# Patient Record
Sex: Male | Born: 1970 | Race: Black or African American | Hispanic: No | Marital: Married | Smoking: Never smoker
Health system: Southern US, Community
[De-identification: ages and names within clinical notes are randomized; demographics above are authoritative.]

---

## 2009-02-13 ENCOUNTER — Emergency Department (HOSPITAL_BASED_OUTPATIENT_CLINIC_OR_DEPARTMENT_OTHER): Admission: EM | Admit: 2009-02-13 | Discharge: 2009-02-13 | Payer: Self-pay | Admitting: Emergency Medicine

## 2013-01-16 ENCOUNTER — Encounter (HOSPITAL_COMMUNITY): Payer: Self-pay | Admitting: Emergency Medicine

## 2013-01-16 ENCOUNTER — Emergency Department (HOSPITAL_COMMUNITY)
Admission: EM | Admit: 2013-01-16 | Discharge: 2013-01-16 | Disposition: A | Discharge status: Home / Self Care | Payer: BC Managed Care – PPO | Attending: Emergency Medicine | Admitting: Emergency Medicine

## 2013-01-16 DIAGNOSIS — M79609 Pain in unspecified limb: Secondary | ICD-10-CM | POA: Insufficient documentation

## 2013-01-16 DIAGNOSIS — M79601 Pain in right arm: Secondary | ICD-10-CM

## 2013-01-16 DIAGNOSIS — Z79899 Other long term (current) drug therapy: Secondary | ICD-10-CM | POA: Insufficient documentation

## 2013-01-16 MED ORDER — HYDROCODONE-ACETAMINOPHEN 5-325 MG PO TABS: ORAL_TABLET | ORAL | Status: AC

## 2013-01-16 MED ORDER — METHOCARBAMOL 500 MG PO TABS: 1000.0000 mg | ORAL_TABLET | Freq: Four times a day (QID) | ORAL | Status: AC

## 2013-01-16 MED ORDER — PREDNISONE 20 MG PO TABS: ORAL_TABLET | ORAL | Status: AC

## 2013-01-16 MED ORDER — HYDROCODONE-ACETAMINOPHEN 5-325 MG PO TABS
1.0000 | ORAL_TABLET | Freq: Once | ORAL | Status: AC
  Administered 2013-01-16: 1 via ORAL
  Filled 2013-01-16: qty 1

## 2013-01-16 NOTE — ED Notes (Signed)
Pt c/o numbness and discomfort in right arm that started around 11:30 this morning when he was at work. Pt states it eased off and came back now its a throbbing consistent feeling. Pt able to move arm and fingers. Pt holding rt shoulder.
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
## 2013-01-16 NOTE — ED Notes (Signed)
Patient presents stating that about 1130 this morning he noticed some numbness to his right arm  Earlier this week he thought he slept wrong and noticed some soreness in his neck

## 2013-01-17 NOTE — ED Provider Notes (Signed)
Medical screening examination/treatment/procedure(s) were performed by non-physician practitioner and as supervising physician I was immediately available for consultation/collaboration.  EKG Interpretation   None        Flint Melter, MD 01/17/13 1152

## 2013-04-19 ENCOUNTER — Other Ambulatory Visit (HOSPITAL_COMMUNITY): Payer: Self-pay | Admitting: Endocrinology

## 2013-04-19 DIAGNOSIS — F909 Attention-deficit hyperactivity disorder, unspecified type: Secondary | ICD-10-CM

## 2013-04-19 DIAGNOSIS — O99280 Endocrine, nutritional and metabolic diseases complicating pregnancy, unspecified trimester: Secondary | ICD-10-CM

## 2013-04-19 DIAGNOSIS — E0590 Thyrotoxicosis, unspecified without thyrotoxic crisis or storm: Secondary | ICD-10-CM

## 2013-04-27 ENCOUNTER — Ambulatory Visit (HOSPITAL_COMMUNITY)
Admission: RE | Admit: 2013-04-27 | Discharge: 2013-04-27 | Disposition: A | Discharge status: Home / Self Care | Payer: BC Managed Care – PPO | Source: Ambulatory Visit | Attending: Endocrinology | Admitting: Endocrinology

## 2013-04-27 DIAGNOSIS — O99280 Endocrine, nutritional and metabolic diseases complicating pregnancy, unspecified trimester: Principal | ICD-10-CM

## 2013-04-27 DIAGNOSIS — E0590 Thyrotoxicosis, unspecified without thyrotoxic crisis or storm: Secondary | ICD-10-CM

## 2013-04-28 ENCOUNTER — Encounter (HOSPITAL_COMMUNITY)
Admission: RE | Admit: 2013-04-28 | Discharge: 2013-04-28 | Disposition: A | Discharge status: Home / Self Care | Payer: BC Managed Care – PPO | Source: Ambulatory Visit | Attending: Endocrinology | Admitting: Endocrinology

## 2013-04-28 ENCOUNTER — Other Ambulatory Visit (HOSPITAL_COMMUNITY): Payer: Self-pay | Admitting: Endocrinology

## 2013-04-28 DIAGNOSIS — E0590 Thyrotoxicosis, unspecified without thyrotoxic crisis or storm: Secondary | ICD-10-CM

## 2013-04-28 DIAGNOSIS — E0510 Thyrotoxicosis with toxic single thyroid nodule without thyrotoxic crisis or storm: Secondary | ICD-10-CM | POA: Insufficient documentation

## 2013-04-28 MED ORDER — SODIUM PERTECHNETATE TC 99M INJECTION
10.5000 | Freq: Once | INTRAVENOUS | Status: AC | PRN
  Administered 2013-04-28: 10.5 via INTRAVENOUS

## 2013-04-28 MED ORDER — SODIUM IODIDE I 131 CAPSULE
8.4000 | Freq: Once | INTRAVENOUS | Status: AC | PRN
  Administered 2013-04-28: 8.4 via ORAL

## 2013-05-06 ENCOUNTER — Other Ambulatory Visit: Payer: Self-pay | Admitting: Endocrinology

## 2013-05-06 DIAGNOSIS — E041 Nontoxic single thyroid nodule: Secondary | ICD-10-CM

## 2013-05-20 ENCOUNTER — Inpatient Hospital Stay: Admission: RE | Admit: 2013-05-20 | Payer: BC Managed Care – PPO | Source: Ambulatory Visit

## 2013-05-20 ENCOUNTER — Inpatient Hospital Stay
Admission: RE | Admit: 2013-05-20 | Discharge: 2013-05-20 | Disposition: A | Discharge status: Home / Self Care | Payer: BC Managed Care – PPO | Source: Ambulatory Visit | Attending: Endocrinology | Admitting: Endocrinology

## 2013-06-03 ENCOUNTER — Ambulatory Visit
Admission: RE | Admit: 2013-06-03 | Discharge: 2013-06-03 | Disposition: A | Discharge status: Home / Self Care | Payer: BC Managed Care – PPO | Source: Ambulatory Visit | Attending: Endocrinology | Admitting: Endocrinology

## 2013-06-03 ENCOUNTER — Other Ambulatory Visit (HOSPITAL_COMMUNITY)
Admission: RE | Admit: 2013-06-03 | Discharge: 2013-06-03 | Disposition: A | Discharge status: Home / Self Care | Payer: BC Managed Care – PPO | Source: Ambulatory Visit | Attending: Interventional Radiology | Admitting: Interventional Radiology

## 2013-06-03 DIAGNOSIS — E041 Nontoxic single thyroid nodule: Secondary | ICD-10-CM

## 2013-06-03 DIAGNOSIS — E049 Nontoxic goiter, unspecified: Secondary | ICD-10-CM | POA: Insufficient documentation

## 2015-04-25 ENCOUNTER — Other Ambulatory Visit: Payer: Self-pay | Admitting: Endocrinology

## 2015-04-25 DIAGNOSIS — E041 Nontoxic single thyroid nodule: Secondary | ICD-10-CM

## 2015-04-28 ENCOUNTER — Ambulatory Visit
Admission: RE | Admit: 2015-04-28 | Discharge: 2015-04-28 | Disposition: A | Discharge status: Home / Self Care | Payer: BLUE CROSS/BLUE SHIELD | Source: Ambulatory Visit | Attending: Endocrinology | Admitting: Endocrinology

## 2015-04-28 DIAGNOSIS — E041 Nontoxic single thyroid nodule: Secondary | ICD-10-CM

## 2015-06-16 DIAGNOSIS — S30861A Insect bite (nonvenomous) of abdominal wall, initial encounter: Secondary | ICD-10-CM | POA: Diagnosis not present

## 2015-06-16 DIAGNOSIS — W57XXXA Bitten or stung by nonvenomous insect and other nonvenomous arthropods, initial encounter: Secondary | ICD-10-CM | POA: Diagnosis not present

## 2015-08-07 DIAGNOSIS — S0096XA Insect bite (nonvenomous) of unspecified part of head, initial encounter: Secondary | ICD-10-CM | POA: Diagnosis not present

## 2015-08-07 DIAGNOSIS — R42 Dizziness and giddiness: Secondary | ICD-10-CM | POA: Diagnosis not present

## 2015-08-07 DIAGNOSIS — I319 Disease of pericardium, unspecified: Secondary | ICD-10-CM | POA: Diagnosis not present

## 2016-04-30 DIAGNOSIS — E041 Nontoxic single thyroid nodule: Secondary | ICD-10-CM | POA: Diagnosis not present

## 2016-05-07 ENCOUNTER — Other Ambulatory Visit: Payer: Self-pay | Admitting: Endocrinology

## 2016-05-07 DIAGNOSIS — E041 Nontoxic single thyroid nodule: Secondary | ICD-10-CM

## 2016-05-07 DIAGNOSIS — E0590 Thyrotoxicosis, unspecified without thyrotoxic crisis or storm: Secondary | ICD-10-CM | POA: Diagnosis not present

## 2016-05-14 ENCOUNTER — Other Ambulatory Visit: Payer: BLUE CROSS/BLUE SHIELD

## 2016-05-17 ENCOUNTER — Ambulatory Visit
Admission: RE | Admit: 2016-05-17 | Discharge: 2016-05-17 | Disposition: A | Discharge status: Home / Self Care | Payer: BLUE CROSS/BLUE SHIELD | Source: Ambulatory Visit | Attending: Endocrinology | Admitting: Endocrinology

## 2016-05-17 DIAGNOSIS — E041 Nontoxic single thyroid nodule: Secondary | ICD-10-CM

## 2016-05-17 DIAGNOSIS — E042 Nontoxic multinodular goiter: Secondary | ICD-10-CM | POA: Diagnosis not present

## 2016-05-31 ENCOUNTER — Other Ambulatory Visit: Payer: Self-pay | Admitting: Endocrinology

## 2016-05-31 DIAGNOSIS — E041 Nontoxic single thyroid nodule: Secondary | ICD-10-CM

## 2016-06-04 ENCOUNTER — Other Ambulatory Visit (HOSPITAL_COMMUNITY)
Admission: RE | Admit: 2016-06-04 | Discharge: 2016-06-04 | Disposition: A | Discharge status: Home / Self Care | Payer: BLUE CROSS/BLUE SHIELD | Source: Ambulatory Visit | Attending: Radiology | Admitting: Radiology

## 2016-06-04 ENCOUNTER — Ambulatory Visit
Admission: RE | Admit: 2016-06-04 | Discharge: 2016-06-04 | Disposition: A | Discharge status: Home / Self Care | Payer: BLUE CROSS/BLUE SHIELD | Source: Ambulatory Visit | Attending: Endocrinology | Admitting: Endocrinology

## 2016-06-04 DIAGNOSIS — E041 Nontoxic single thyroid nodule: Secondary | ICD-10-CM | POA: Diagnosis not present

## 2017-05-13 DIAGNOSIS — E041 Nontoxic single thyroid nodule: Secondary | ICD-10-CM | POA: Diagnosis not present

## 2018-04-02 DIAGNOSIS — M5386 Other specified dorsopathies, lumbar region: Secondary | ICD-10-CM | POA: Diagnosis not present

## 2018-04-02 DIAGNOSIS — M5137 Other intervertebral disc degeneration, lumbosacral region: Secondary | ICD-10-CM | POA: Diagnosis not present

## 2018-04-02 DIAGNOSIS — M9903 Segmental and somatic dysfunction of lumbar region: Secondary | ICD-10-CM | POA: Diagnosis not present

## 2018-04-02 DIAGNOSIS — M9904 Segmental and somatic dysfunction of sacral region: Secondary | ICD-10-CM | POA: Diagnosis not present

## 2018-04-03 DIAGNOSIS — M545 Low back pain: Secondary | ICD-10-CM | POA: Diagnosis not present

## 2018-04-03 DIAGNOSIS — M5137 Other intervertebral disc degeneration, lumbosacral region: Secondary | ICD-10-CM | POA: Diagnosis not present

## 2018-04-03 DIAGNOSIS — M9904 Segmental and somatic dysfunction of sacral region: Secondary | ICD-10-CM | POA: Diagnosis not present

## 2018-04-03 DIAGNOSIS — M9903 Segmental and somatic dysfunction of lumbar region: Secondary | ICD-10-CM | POA: Diagnosis not present

## 2018-04-03 DIAGNOSIS — M5386 Other specified dorsopathies, lumbar region: Secondary | ICD-10-CM | POA: Diagnosis not present

## 2018-04-09 DIAGNOSIS — M9904 Segmental and somatic dysfunction of sacral region: Secondary | ICD-10-CM | POA: Diagnosis not present

## 2018-04-09 DIAGNOSIS — M5137 Other intervertebral disc degeneration, lumbosacral region: Secondary | ICD-10-CM | POA: Diagnosis not present

## 2018-04-09 DIAGNOSIS — M9903 Segmental and somatic dysfunction of lumbar region: Secondary | ICD-10-CM | POA: Diagnosis not present

## 2018-04-09 DIAGNOSIS — M5386 Other specified dorsopathies, lumbar region: Secondary | ICD-10-CM | POA: Diagnosis not present

## 2018-04-16 DIAGNOSIS — M5137 Other intervertebral disc degeneration, lumbosacral region: Secondary | ICD-10-CM | POA: Diagnosis not present

## 2018-04-16 DIAGNOSIS — M9903 Segmental and somatic dysfunction of lumbar region: Secondary | ICD-10-CM | POA: Diagnosis not present

## 2018-04-16 DIAGNOSIS — M5386 Other specified dorsopathies, lumbar region: Secondary | ICD-10-CM | POA: Diagnosis not present

## 2018-04-16 DIAGNOSIS — M9904 Segmental and somatic dysfunction of sacral region: Secondary | ICD-10-CM | POA: Diagnosis not present

## 2018-04-23 DIAGNOSIS — M9903 Segmental and somatic dysfunction of lumbar region: Secondary | ICD-10-CM | POA: Diagnosis not present

## 2018-04-23 DIAGNOSIS — M545 Low back pain: Secondary | ICD-10-CM | POA: Diagnosis not present

## 2018-04-23 DIAGNOSIS — M9904 Segmental and somatic dysfunction of sacral region: Secondary | ICD-10-CM | POA: Diagnosis not present

## 2018-04-23 DIAGNOSIS — M5137 Other intervertebral disc degeneration, lumbosacral region: Secondary | ICD-10-CM | POA: Diagnosis not present

## 2018-04-23 DIAGNOSIS — M5386 Other specified dorsopathies, lumbar region: Secondary | ICD-10-CM | POA: Diagnosis not present

## 2018-04-29 ENCOUNTER — Other Ambulatory Visit: Payer: Self-pay | Admitting: Endocrinology

## 2018-04-29 DIAGNOSIS — E041 Nontoxic single thyroid nodule: Secondary | ICD-10-CM

## 2018-05-07 DIAGNOSIS — M5137 Other intervertebral disc degeneration, lumbosacral region: Secondary | ICD-10-CM | POA: Diagnosis not present

## 2018-05-07 DIAGNOSIS — M9903 Segmental and somatic dysfunction of lumbar region: Secondary | ICD-10-CM | POA: Diagnosis not present

## 2018-05-07 DIAGNOSIS — E041 Nontoxic single thyroid nodule: Secondary | ICD-10-CM | POA: Diagnosis not present

## 2018-05-07 DIAGNOSIS — M9904 Segmental and somatic dysfunction of sacral region: Secondary | ICD-10-CM | POA: Diagnosis not present

## 2018-05-07 DIAGNOSIS — M5386 Other specified dorsopathies, lumbar region: Secondary | ICD-10-CM | POA: Diagnosis not present

## 2018-05-12 DIAGNOSIS — M545 Low back pain: Secondary | ICD-10-CM | POA: Diagnosis not present

## 2018-05-13 DIAGNOSIS — M545 Low back pain: Secondary | ICD-10-CM | POA: Diagnosis not present

## 2018-05-14 DIAGNOSIS — E041 Nontoxic single thyroid nodule: Secondary | ICD-10-CM | POA: Diagnosis not present

## 2018-05-14 DIAGNOSIS — E0590 Thyrotoxicosis, unspecified without thyrotoxic crisis or storm: Secondary | ICD-10-CM | POA: Diagnosis not present

## 2018-05-21 DIAGNOSIS — M48061 Spinal stenosis, lumbar region without neurogenic claudication: Secondary | ICD-10-CM | POA: Diagnosis not present

## 2018-05-28 DIAGNOSIS — M545 Low back pain: Secondary | ICD-10-CM | POA: Diagnosis not present

## 2018-05-28 DIAGNOSIS — M5416 Radiculopathy, lumbar region: Secondary | ICD-10-CM | POA: Diagnosis not present

## 2018-05-28 DIAGNOSIS — M48061 Spinal stenosis, lumbar region without neurogenic claudication: Secondary | ICD-10-CM | POA: Diagnosis not present

## 2018-06-25 ENCOUNTER — Other Ambulatory Visit: Payer: BLUE CROSS/BLUE SHIELD

## 2018-08-13 DIAGNOSIS — E041 Nontoxic single thyroid nodule: Secondary | ICD-10-CM | POA: Diagnosis not present

## 2018-08-17 ENCOUNTER — Other Ambulatory Visit: Payer: BLUE CROSS/BLUE SHIELD

## 2018-08-27 ENCOUNTER — Ambulatory Visit
Admission: RE | Admit: 2018-08-27 | Discharge: 2018-08-27 | Disposition: A | Discharge status: Home / Self Care | Payer: BC Managed Care – PPO | Source: Ambulatory Visit | Attending: Endocrinology | Admitting: Endocrinology

## 2018-08-27 DIAGNOSIS — E041 Nontoxic single thyroid nodule: Secondary | ICD-10-CM | POA: Diagnosis not present

## 2019-01-07 ENCOUNTER — Other Ambulatory Visit: Payer: Self-pay

## 2019-01-07 DIAGNOSIS — Z20822 Contact with and (suspected) exposure to covid-19: Secondary | ICD-10-CM

## 2019-01-11 LAB — NOVEL CORONAVIRUS, NAA: SARS-CoV-2, NAA: DETECTED — AB

## 2019-02-17 IMAGING — US US THYROID
1 series · 16 of 25 positions shown · non-contrast
Comparison: 04/28/2015
COMPARISON: 04/28/2015

ADDENDUM:
The measurements of nodule to a been corrected. Actual measurements
are 2.6 x 1.8 x 2.0 cm. Celsius corrected dictation below:
CLINICAL DATA: Prior ultrasound follow-up.

EXAM:
THYROID ULTRASOUND
TECHNIQUE: Ultrasound examination of the thyroid gland and adjacent soft
tissues was performed.

[Series 1: us thyroid · 0.07mm/px · 65 acquisitions, 16 frames shown]
[im 1/65]
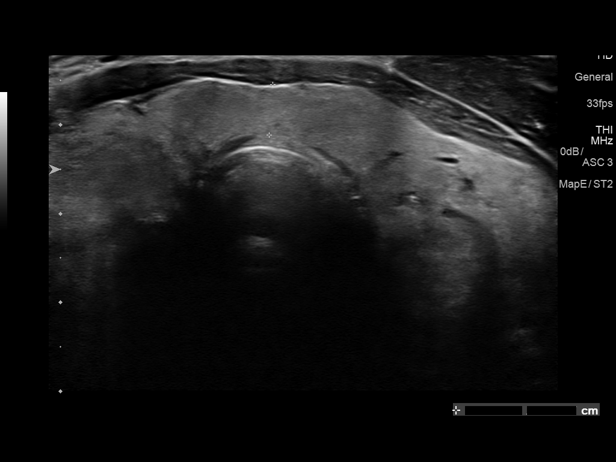
[im 6/65]
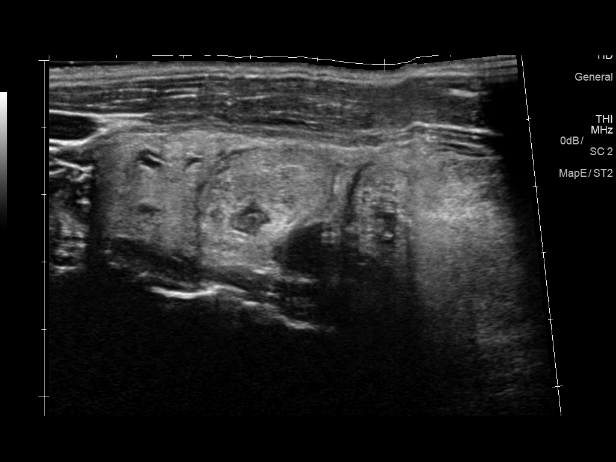
[im 9/65]
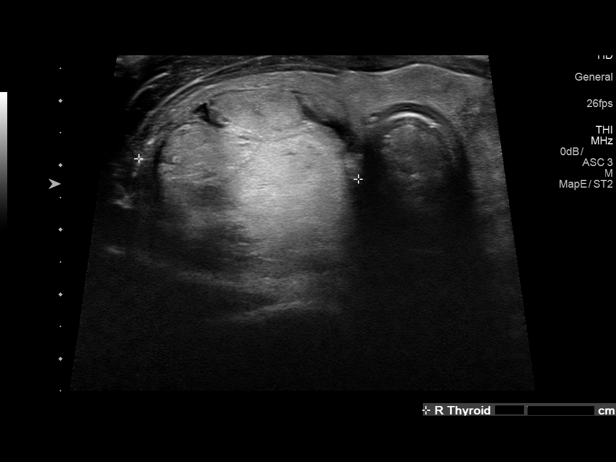
[im 14/65]
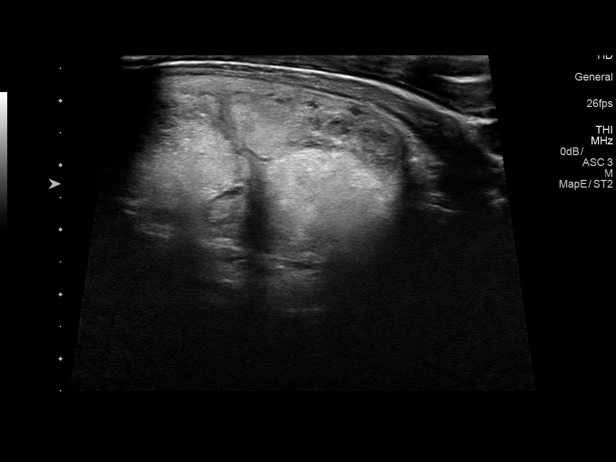
[im 19/65]
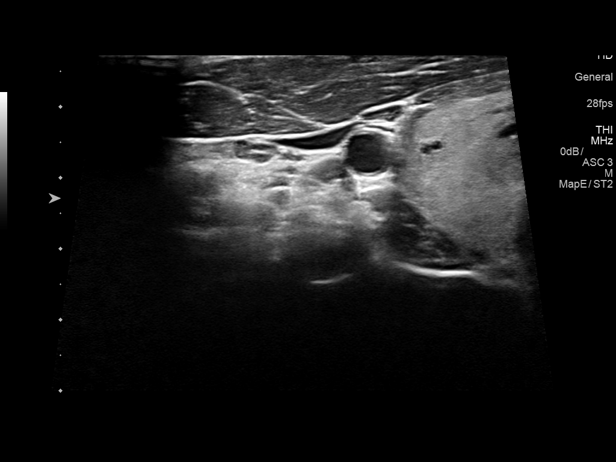
[im 22/65]
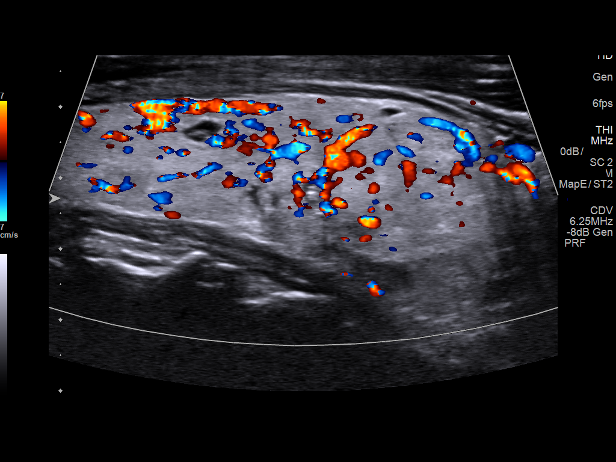
[im 27/65]
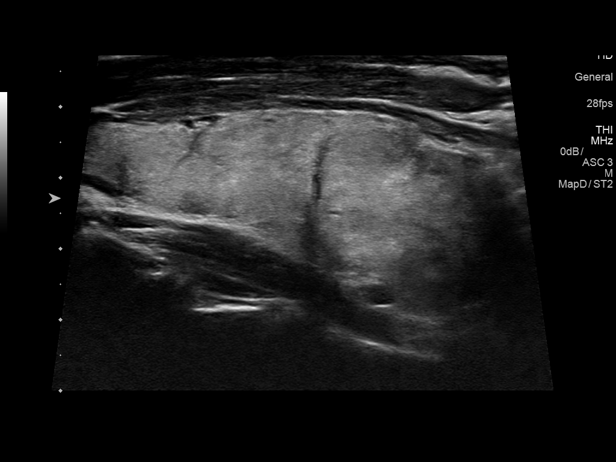
[im 30/65]
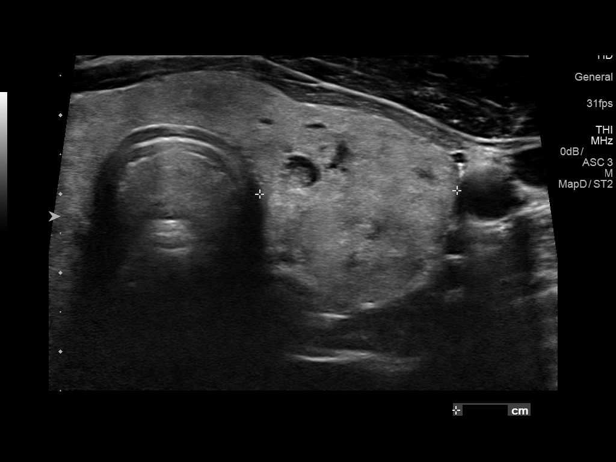
[im 35/65]
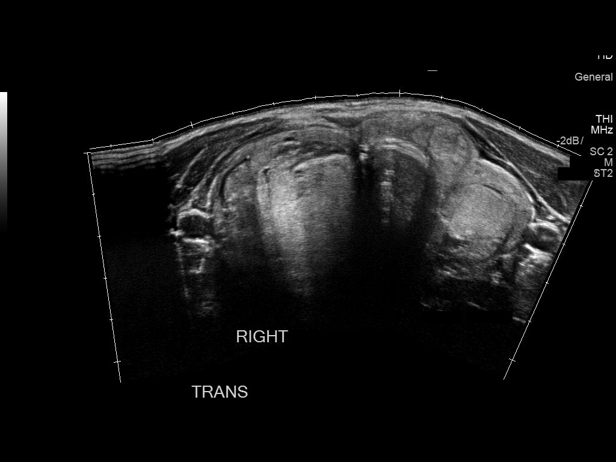
[im 38/65]
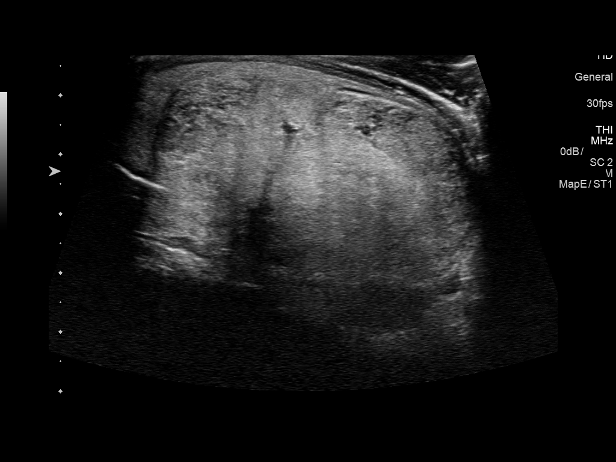
[im 43/65]
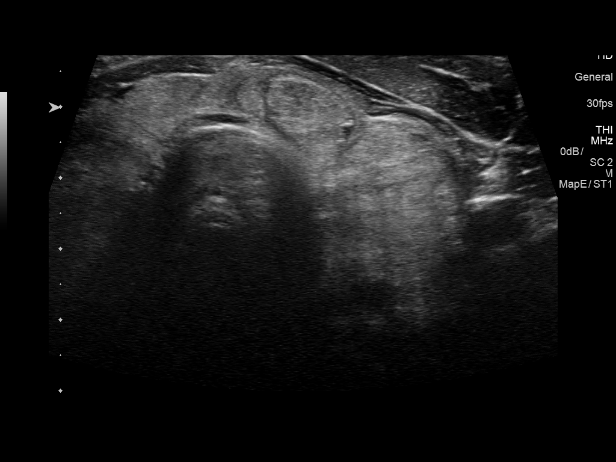
[im 46/65]
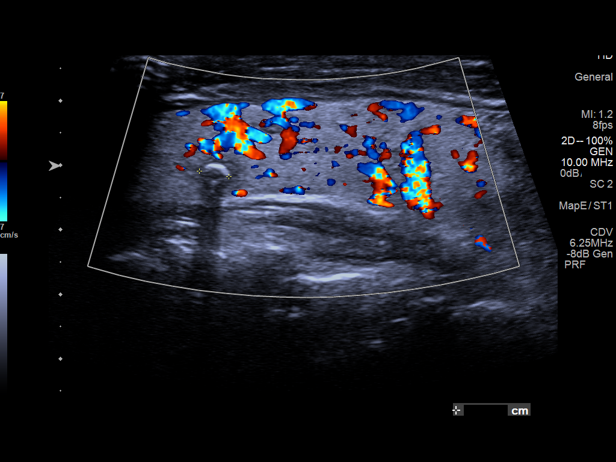
[im 51/65]
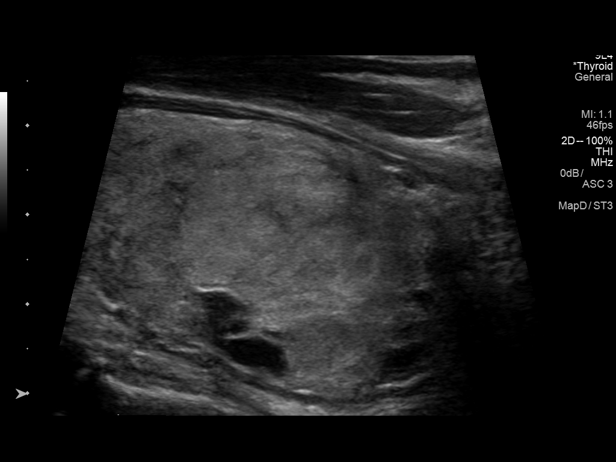
[im 57/65]
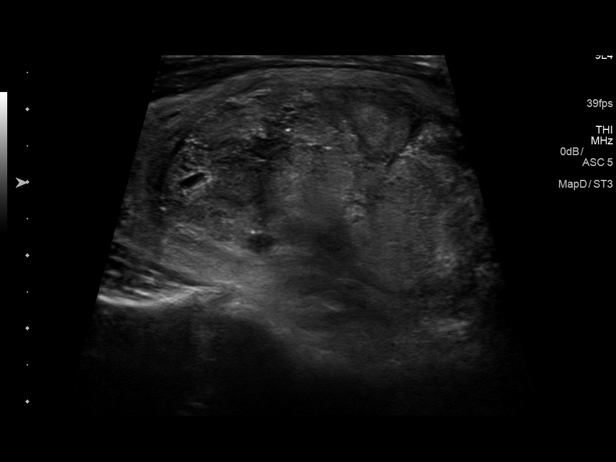
[im 59/65]
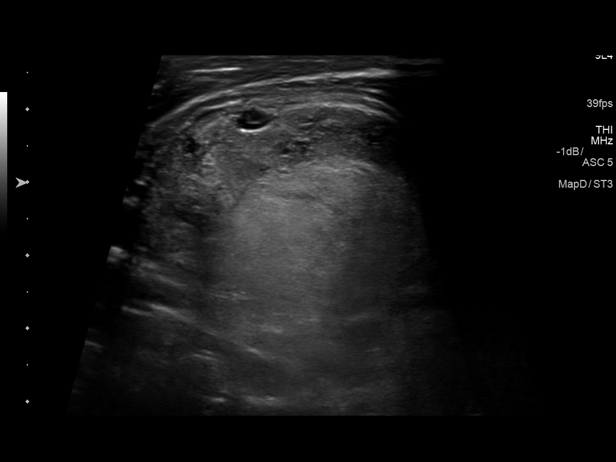
[im 65/65]
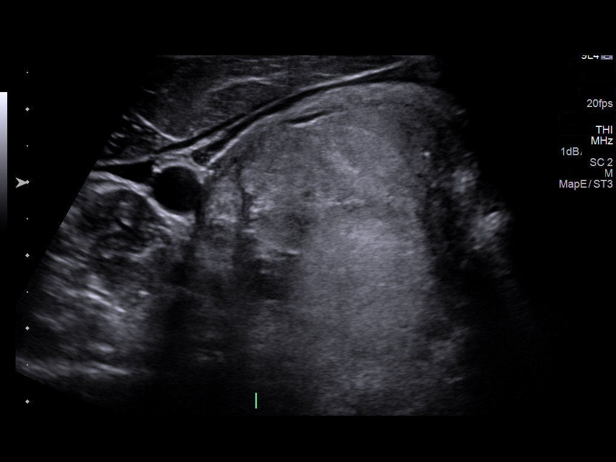

[16 of 25 positions shown; findings below may reference images not displayed]

FINDINGS: Parenchymal Echotexture: Markedly heterogenous

Isthmus: 0.6 cm, previously 0.5 cm

Right lobe: 7.1 x 2.2 x 3.4 cm, previously 7.5 x 3.5 x 3.4 cm

Left lobe: 6.6 x 2.4 x 2.5 cm, previously 7.1 x 2.5 x 2.5 cm

_________________________________________________________

Estimated total number of nodules >/= 1 cm: 3

Number of spongiform nodules >/= 2 cm not described below (TR1): 0

Number of mixed cystic and solid nodules >/= 1.5 cm not described
below (TR2): 0

_________________________________________________________

Nodule # 1:

Prior biopsy: No

Location: Isthmus; Mid

Maximum size: 1.7 cm; Other 2 dimensions: 0.9 x 1.3 cm, previously,
1.5 cm

Composition: solid/almost completely solid (2)

Echogenicity: isoechoic (1)

Shape: not taller-than-wide (0)

Margins: smooth (0)

Echogenic foci: none (0)

ACR TI-RADS total points: 3.

ACR TI-RADS risk category: TR3 (3 points).

Significant change in size (>/= 20% in two dimensions and minimal
increase of 2 mm): No

Change in features: No

Change in ACR TI-RADS risk category: No

ACR TI-RADS recommendations:

*Given size (>/= 1.5 - 2.4 cm) and appearance, a follow-up
ultrasound in 1 year should be considered based on TI-RADS criteria.
No significant change.

_________________________________________________________

Nodule # 2:

Prior biopsy: No

Location: Left; Inferior

Maximum size: 2.6 cm; Other 2 dimensions: 1.8 x 2.0 cm, previously,
0.3 cm

Composition: solid/almost completely solid (2)

Echogenicity: isoechoic (1)

Shape: not taller-than-wide (0)

Margins: smooth (0)

Echogenic foci: none (0)

ACR TI-RADS total points: 3.

ACR TI-RADS risk category: TR3 (3 points).

Significant change in size (>/= 20% in two dimensions and minimal
increase of 2 mm): No

Change in features: No

Change in ACR TI-RADS risk category: No

ACR TI-RADS recommendations:

**Given size (>/= 2.5 cm) and appearance, fine needle aspiration of
this mildly suspicious nodule should be considered based on TI-RADS
criteria. This nodule is not significantly changed.

_________________________________________________________

Right nodule measures 5.9 cm and previously measured 5.9 cm. It was
previously biopsied.
IMPRESSION: Multiple bilateral nodules are not significantly changed as
described above.

The above is in keeping with the ACR TI-RADS recommendations - [HOSPITAL] 3185;[DATE].
FINDINGS: Parenchymal Echotexture: Markedly heterogenous

Isthmus: 0.6 cm, previously 0.5 cm

Right lobe: 7.1 x 2.2 x 3.4 cm, previously 7.5 x 3.5 x 3.4 cm

Left lobe: 6.6 x 2.4 x 2.5 cm, previously 7.1 x 2.5 x 2.5 cm

_________________________________________________________

Estimated total number of nodules >/= 1 cm: 3

Number of spongiform nodules >/=  2 cm not described below (TR1): 0

Number of mixed cystic and solid nodules >/= 1.5 cm not described
below (TR2): 0

_________________________________________________________

Nodule # 1:

Prior biopsy: No

Location: Isthmus; Mid

Maximum size: 1.7 cm; Other 2 dimensions: 0.9 x 1.3 cm, previously,
1.5 cm

Composition: solid/almost completely solid (2)

Echogenicity: isoechoic (1)

Shape: not taller-than-wide (0)

Margins: smooth (0)

Echogenic foci: none (0)

ACR TI-RADS total points: 3.

ACR TI-RADS risk category:  TR3 (3 points).

Significant change in size (>/= 20% in two dimensions and minimal
increase of 2 mm): No

Change in features: No

Change in ACR TI-RADS risk category: No

ACR TI-RADS recommendations:

*Given size (>/= 1.5 - 2.4 cm) and appearance, a follow-up
ultrasound in 1 year should be considered based on TI-RADS criteria.
No significant change.

_________________________________________________________

Nodule # 2:

Prior biopsy: No

Location: Left; Inferior

Maximum size: 0.6 cm; Other 2 dimensions: 1.8 x 2.0 cm, previously,
0.3 cm

Composition: solid/almost completely solid (2)

Echogenicity: isoechoic (1)

Shape: not taller-than-wide (0)

Margins: smooth (0)

Echogenic foci: none (0)

ACR TI-RADS total points: 3.

ACR TI-RADS risk category:  TR3 (3 points).

Significant change in size (>/= 20% in two dimensions and minimal
increase of 2 mm): No

Change in features: No

Change in ACR TI-RADS risk category: No

ACR TI-RADS recommendations:

**Given size (>/= 2.5 cm) and appearance, fine needle aspiration of
this mildly suspicious nodule should be considered based on TI-RADS
criteria. This nodule is not significantly changed.

_________________________________________________________

Right nodule measures 5.9 cm and previously measured 5.9 cm. It was
previously biopsied.
IMPRESSION: Multiple bilateral nodules are not significantly changed as
described above.

The above is in keeping with the ACR TI-RADS recommendations - [HOSPITAL] 3185;[DATE].

## 2019-04-01 DIAGNOSIS — R42 Dizziness and giddiness: Secondary | ICD-10-CM | POA: Diagnosis not present

## 2019-04-01 DIAGNOSIS — E041 Nontoxic single thyroid nodule: Secondary | ICD-10-CM | POA: Diagnosis not present

## 2019-04-01 DIAGNOSIS — E0590 Thyrotoxicosis, unspecified without thyrotoxic crisis or storm: Secondary | ICD-10-CM | POA: Diagnosis not present

## 2019-05-20 DIAGNOSIS — E041 Nontoxic single thyroid nodule: Secondary | ICD-10-CM | POA: Diagnosis not present

## 2019-05-27 DIAGNOSIS — E0590 Thyrotoxicosis, unspecified without thyrotoxic crisis or storm: Secondary | ICD-10-CM | POA: Diagnosis not present

## 2019-05-27 DIAGNOSIS — E041 Nontoxic single thyroid nodule: Secondary | ICD-10-CM | POA: Diagnosis not present

## 2020-05-18 DIAGNOSIS — E041 Nontoxic single thyroid nodule: Secondary | ICD-10-CM | POA: Diagnosis not present

## 2021-05-17 IMAGING — US US THYROID
1 series · 13 of 25 positions shown · non-contrast
Comparison: May 17, 2016

CLINICAL DATA: 48-year-old male with a history of thyroid nodules

Biopsy of the right thyroid nodule has been performed June 03, 2013. Biopsy of the left thyroid nodule has performed June 04, 2016
EXAM:
THYROID ULTRASOUND
TECHNIQUE: Ultrasound examination of the thyroid gland and adjacent soft
tissues was performed.

[Series 1: us thyroid · 0.06mm/px · 13 of 77 slices shown]
[im 1/77]
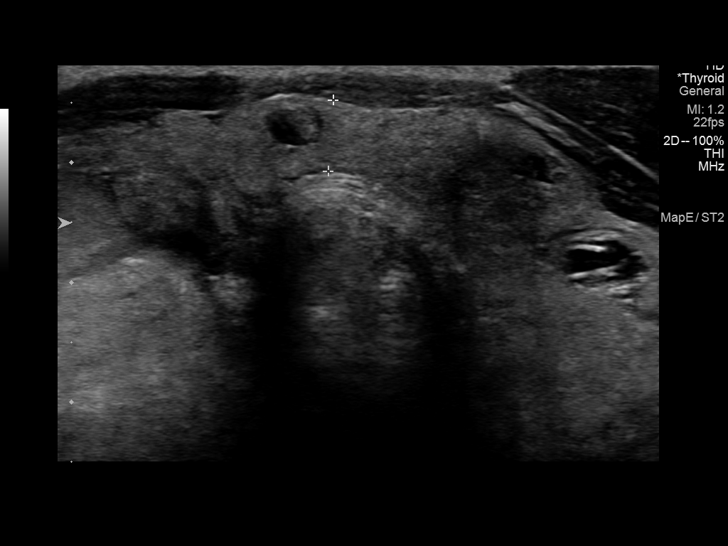
[im 7/77]
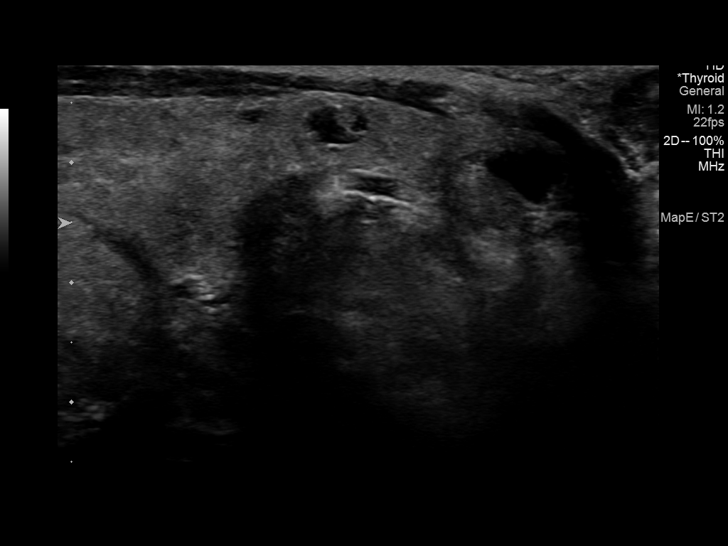
[im 13/77]
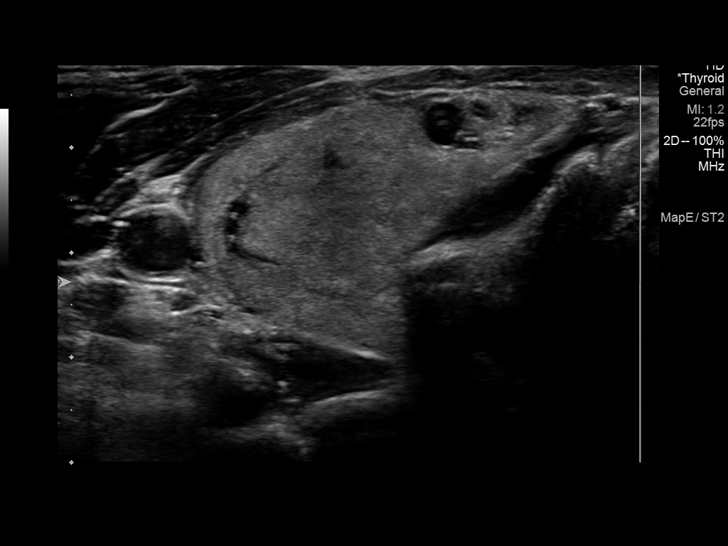
[im 20/77]
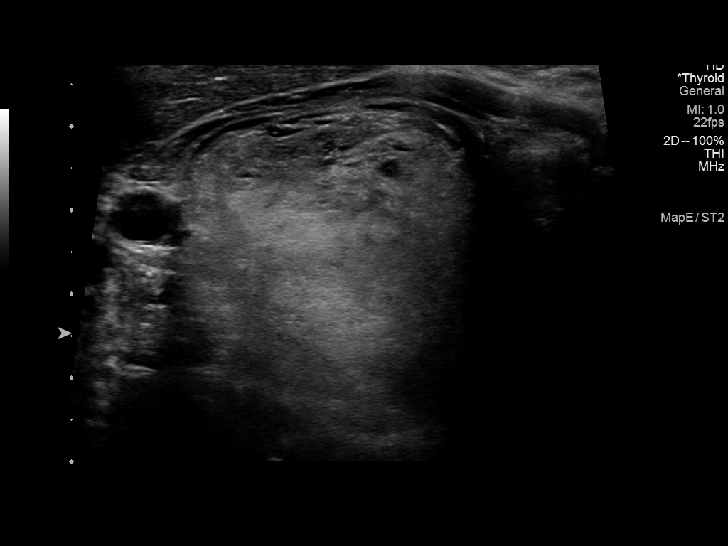
[im 26/77]
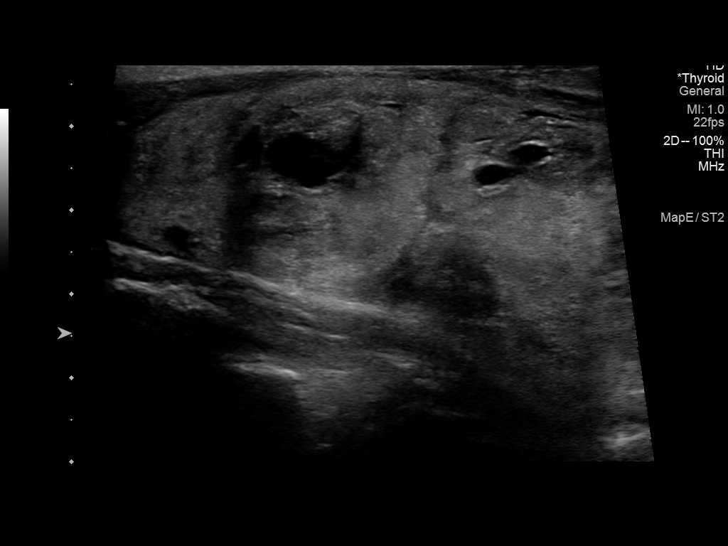
[im 32/77]
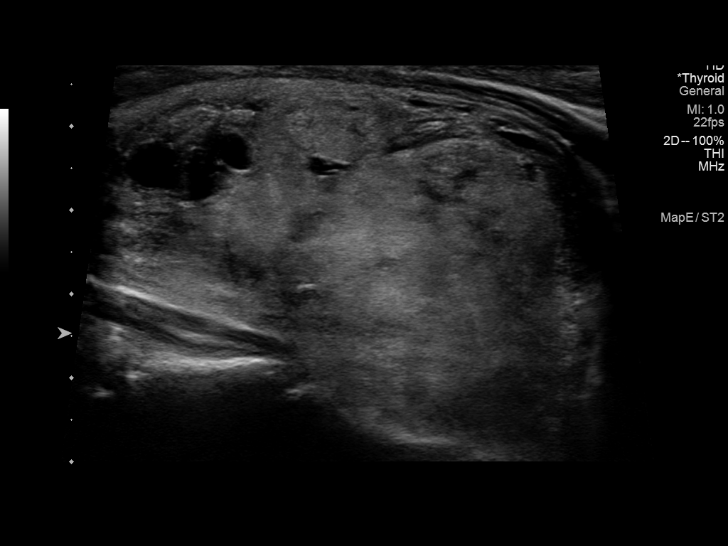
[im 39/77]
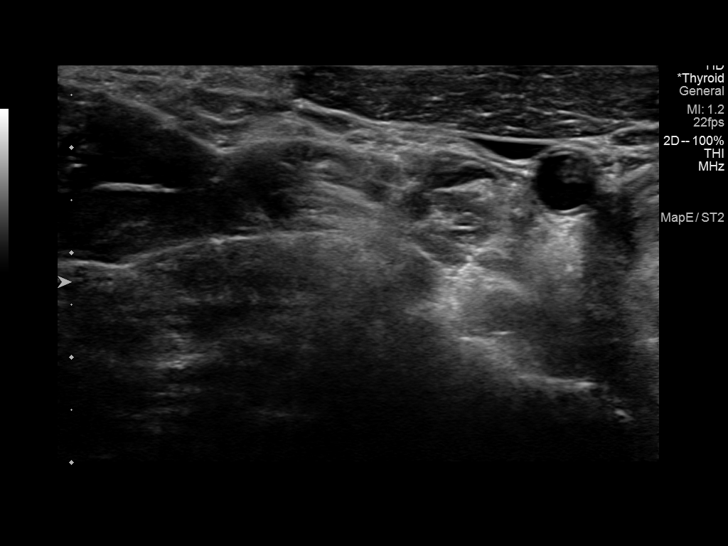
[im 45/77]
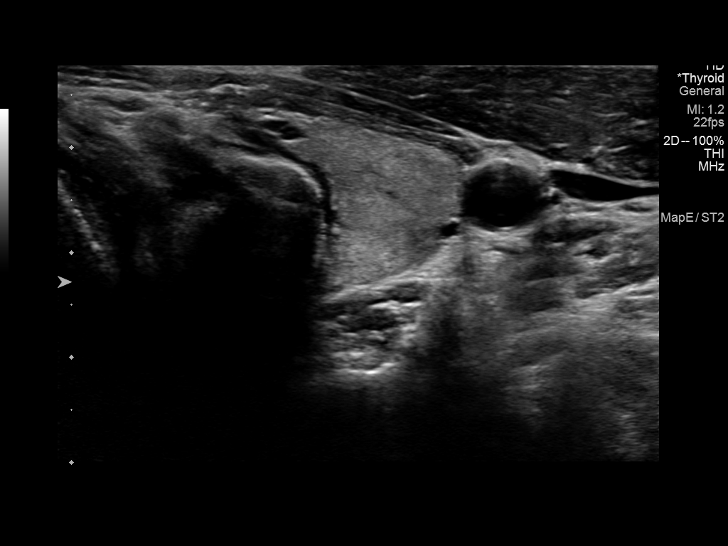
[im 51/77]
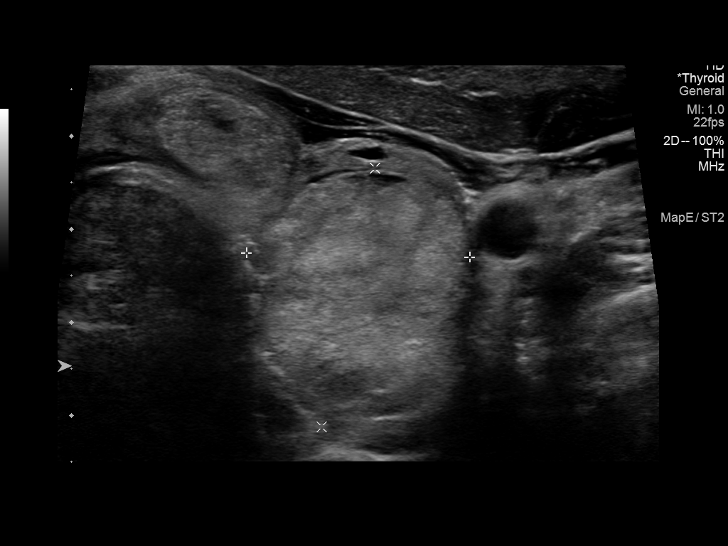
[im 58/77]
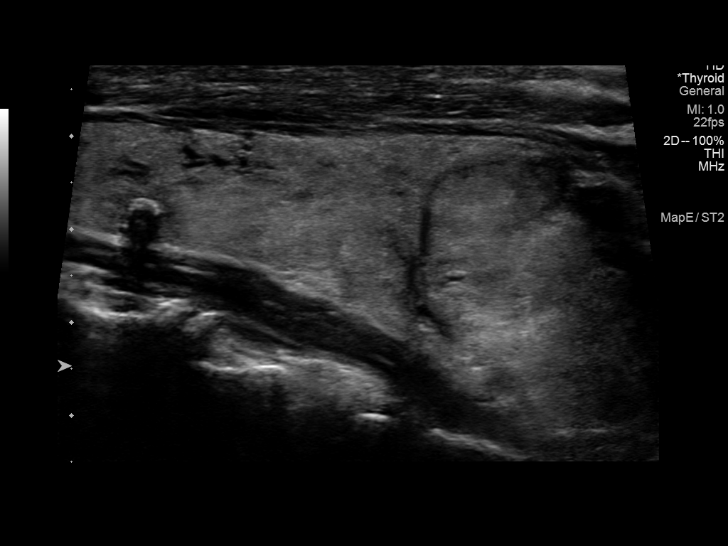
[im 64/77]
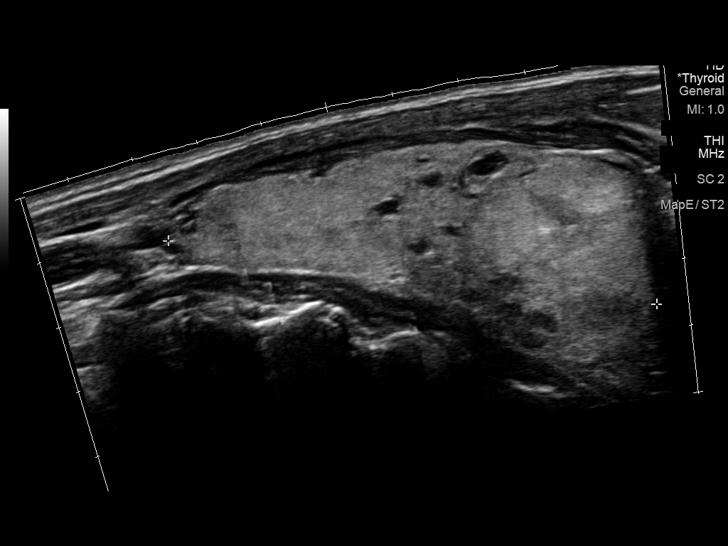
[im 70/77]
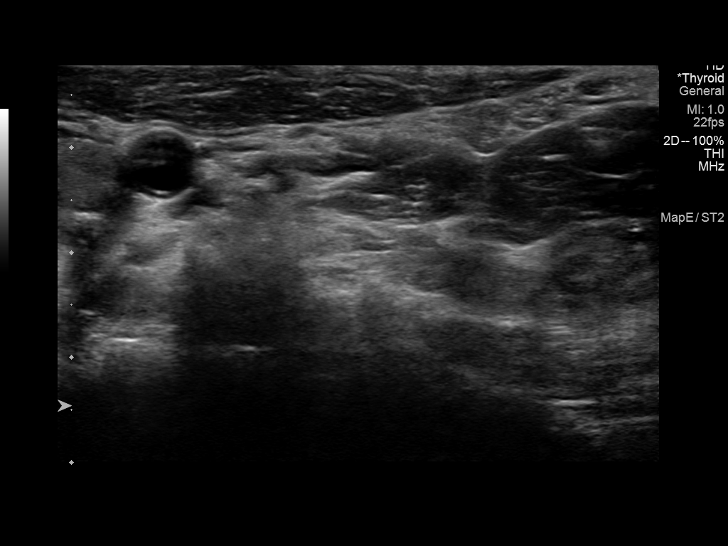
[im 77/77]
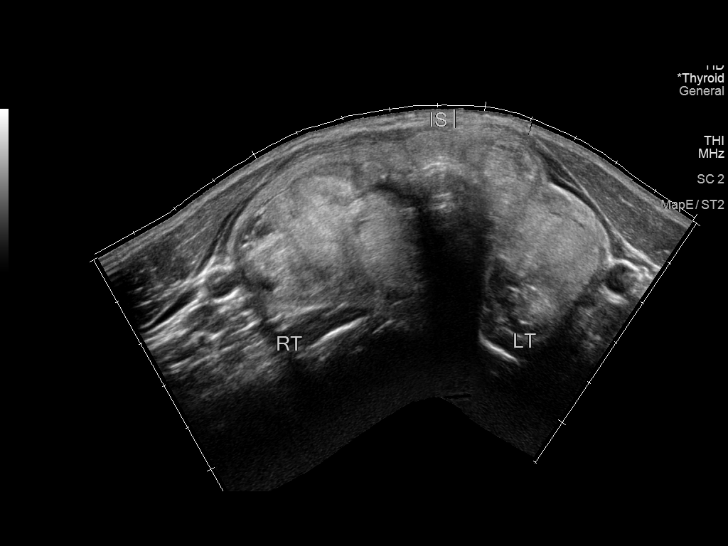

[13 of 25 positions shown; findings below may reference images not displayed]

FINDINGS: Parenchymal Echotexture: Moderately heterogenous

Isthmus: 0.6 cm

Right lobe: 7.5 cm x 3.8 cm x 4.1 cm

Left lobe: 7.3 cm x 3.1 cm x 2.6 cm

_________________________________________________________

Estimated total number of nodules >/= 1 cm: 3

Number of spongiform nodules >/=  2 cm not described below (TR1): 0

Number of mixed cystic and solid nodules >/= 1.5 cm not described
below (TR2): 0

_________________________________________________________

Nodule labeled 1 within the right thyroid measures slightly larger
than previous, currently 6.2 cm. This nodule was previously
biopsied.

Nodule at the inferior left thyroid labeled 2 has increased in size
from the prior, currently 4.7 cm

Nodule # 3:

Location: Isthmus; left

Maximum size: 2.0 cm; Other 2 dimensions: 1.4 cm x 1.9 cm

Composition: cannot determine (2)

Echogenicity: isoechoic (1)

Shape: not taller-than-wide (0)

Margins: smooth (0)

Echogenic foci: none (0)

ACR TI-RADS total points: 3.

ACR TI-RADS risk category: TR3 (3 points).

ACR TI-RADS recommendations:

Nodule meets criteria for surveillance

Nodule # 4:

Location: Isthmus; right

Maximum size: 0.6 cm cm; Other 2 dimensions: 0.3 cm x 0.6 cm cm

Composition: mixed cystic and solid (1)

Echogenicity: isoechoic (1)

Shape: not taller-than-wide (0)

Margins: smooth (0)

Echogenic foci: none (0)

ACR TI-RADS total points: 2.

ACR TI-RADS risk category: TR2 (2 points).

ACR TI-RADS recommendations:

Nodule does not meet criteria for surveillance or biopsy

_________________________________________________________

No adenopathy
IMPRESSION: Left isthmic thyroid nodule (labeled 3) meets criteria for
surveillance, as designated by the newly established ACR TI-RADS
criteria. Surveillance ultrasound study recommended to be performed
annually up to 5 years.

The right and left thyroid nodules have been previously biopsied.
Assuming benign result, no further specific follow-up would be
recommended.

Recommendations follow those established by the new ACR TI-RADS
criteria ([HOSPITAL] 2024;[DATE]).

## 2021-07-03 ENCOUNTER — Other Ambulatory Visit: Payer: Self-pay | Admitting: Endocrinology

## 2021-07-03 DIAGNOSIS — E041 Nontoxic single thyroid nodule: Secondary | ICD-10-CM

## 2022-12-23 ENCOUNTER — Other Ambulatory Visit: Payer: Self-pay | Admitting: Endocrinology

## 2022-12-23 DIAGNOSIS — E041 Nontoxic single thyroid nodule: Secondary | ICD-10-CM

## 2023-01-24 ENCOUNTER — Ambulatory Visit
Admission: RE | Admit: 2023-01-24 | Discharge: 2023-01-24 | Disposition: A | Discharge status: Home / Self Care | Payer: BC Managed Care – PPO | Source: Ambulatory Visit | Attending: Endocrinology | Admitting: Endocrinology

## 2023-01-24 DIAGNOSIS — E041 Nontoxic single thyroid nodule: Secondary | ICD-10-CM
